# Patient Record
Sex: Male | Born: 1980 | Race: White | Hispanic: No | Marital: Single | Smoking: Never smoker
Health system: Southern US, Community
[De-identification: ages and names within clinical notes are randomized; demographics above are authoritative.]

## PROBLEM LIST (undated history)

## (undated) HISTORY — PX: OTHER SURGICAL HISTORY: SHX169

---

## 2014-11-24 ENCOUNTER — Emergency Department (HOSPITAL_COMMUNITY): Payer: Managed Care, Other (non HMO)

## 2014-11-24 ENCOUNTER — Encounter (HOSPITAL_COMMUNITY): Payer: Self-pay | Admitting: Emergency Medicine

## 2014-11-24 ENCOUNTER — Emergency Department (HOSPITAL_COMMUNITY)
Admission: EM | Admit: 2014-11-24 | Discharge: 2014-11-24 | Disposition: A | Discharge status: Home / Self Care | Payer: Managed Care, Other (non HMO) | Attending: Emergency Medicine | Admitting: Emergency Medicine

## 2014-11-24 DIAGNOSIS — Y999 Unspecified external cause status: Secondary | ICD-10-CM | POA: Diagnosis not present

## 2014-11-24 DIAGNOSIS — M5440 Lumbago with sciatica, unspecified side: Secondary | ICD-10-CM | POA: Insufficient documentation

## 2014-11-24 DIAGNOSIS — M5442 Lumbago with sciatica, left side: Secondary | ICD-10-CM

## 2014-11-24 DIAGNOSIS — Y9389 Activity, other specified: Secondary | ICD-10-CM | POA: Diagnosis not present

## 2014-11-24 DIAGNOSIS — S3992XA Unspecified injury of lower back, initial encounter: Secondary | ICD-10-CM | POA: Diagnosis present

## 2014-11-24 DIAGNOSIS — S199XXA Unspecified injury of neck, initial encounter: Secondary | ICD-10-CM | POA: Insufficient documentation

## 2014-11-24 DIAGNOSIS — Z041 Encounter for examination and observation following transport accident: Secondary | ICD-10-CM

## 2014-11-24 DIAGNOSIS — Y92410 Unspecified street and highway as the place of occurrence of the external cause: Secondary | ICD-10-CM | POA: Insufficient documentation

## 2014-11-24 DIAGNOSIS — M5441 Lumbago with sciatica, right side: Secondary | ICD-10-CM

## 2014-11-24 DIAGNOSIS — R52 Pain, unspecified: Secondary | ICD-10-CM

## 2014-11-24 DIAGNOSIS — Z043 Encounter for examination and observation following other accident: Secondary | ICD-10-CM

## 2014-11-24 MED ORDER — METHOCARBAMOL 500 MG PO TABS
500.0000 mg | ORAL_TABLET | Freq: Once | ORAL | Status: AC
  Administered 2014-11-24: 500 mg via ORAL
  Filled 2014-11-24: qty 1

## 2014-11-24 MED ORDER — NAPROXEN 500 MG PO TABS: 500.0000 mg | ORAL_TABLET | Freq: Two times a day (BID) | ORAL | Status: AC

## 2014-11-24 MED ORDER — METHOCARBAMOL 500 MG PO TABS: 500.0000 mg | ORAL_TABLET | Freq: Two times a day (BID) | ORAL | Status: AC
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
## 2017-11-03 ENCOUNTER — Emergency Department (HOSPITAL_COMMUNITY): Payer: No Typology Code available for payment source

## 2017-11-03 ENCOUNTER — Other Ambulatory Visit: Payer: Self-pay

## 2017-11-03 ENCOUNTER — Encounter (HOSPITAL_COMMUNITY): Payer: Self-pay

## 2017-11-03 ENCOUNTER — Emergency Department (HOSPITAL_COMMUNITY)
Admission: EM | Admit: 2017-11-03 | Discharge: 2017-11-03 | Disposition: A | Discharge status: Home / Self Care | Payer: No Typology Code available for payment source | Attending: Emergency Medicine | Admitting: Emergency Medicine

## 2017-11-03 DIAGNOSIS — K7689 Other specified diseases of liver: Secondary | ICD-10-CM | POA: Diagnosis not present

## 2017-11-03 DIAGNOSIS — R55 Syncope and collapse: Secondary | ICD-10-CM | POA: Insufficient documentation

## 2017-11-03 LAB — CBC WITH DIFFERENTIAL/PLATELET
BASOS ABS: 0.1 10*3/uL (ref 0.0–0.1)
BASOS PCT: 1 %
Eosinophils Absolute: 0.2 10*3/uL (ref 0.0–0.7)
Eosinophils Relative: 3 %
HCT: 41.9 % (ref 39.0–52.0)
HEMOGLOBIN: 14.7 g/dL (ref 13.0–17.0)
Lymphocytes Relative: 29 %
Lymphs Abs: 2.4 10*3/uL (ref 0.7–4.0)
MCH: 30.4 pg (ref 26.0–34.0)
MCHC: 35.1 g/dL (ref 30.0–36.0)
MCV: 86.7 fL (ref 78.0–100.0)
Monocytes Absolute: 1.3 10*3/uL — ABNORMAL HIGH (ref 0.1–1.0)
Monocytes Relative: 16 %
NEUTROS ABS: 4.2 10*3/uL (ref 1.7–7.7)
NEUTROS PCT: 51 %
Platelets: 292 10*3/uL (ref 150–400)
RBC: 4.83 MIL/uL (ref 4.22–5.81)
RDW: 12.8 % (ref 11.5–15.5)
WBC: 8.2 10*3/uL (ref 4.0–10.5)

## 2017-11-03 LAB — COMPREHENSIVE METABOLIC PANEL
ALT: 29 U/L (ref 0–44)
ANION GAP: 8 (ref 5–15)
AST: 46 U/L — ABNORMAL HIGH (ref 15–41)
Albumin: 4.1 g/dL (ref 3.5–5.0)
Alkaline Phosphatase: 52 U/L (ref 38–126)
BILIRUBIN TOTAL: 1.3 mg/dL — AB (ref 0.3–1.2)
BUN: 10 mg/dL (ref 6–20)
CALCIUM: 8.9 mg/dL (ref 8.9–10.3)
CO2: 28 mmol/L (ref 22–32)
Chloride: 105 mmol/L (ref 98–111)
Creatinine, Ser: 0.9 mg/dL (ref 0.61–1.24)
GFR calc non Af Amer: >60 mL/min (ref >60)
GLUCOSE: 98 mg/dL (ref 70–99)
Potassium: 4.1 mmol/L (ref 3.5–5.1)
Sodium: 141 mmol/L (ref 135–145)
TOTAL PROTEIN: 7.1 g/dL (ref 6.5–8.1)

## 2017-11-03 LAB — ETHANOL: Alcohol, Ethyl (B): <10 mg/dL (ref <10)
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
## 2017-11-03 NOTE — ED Notes (Signed)
Pt aware urine sample is needed, pt states that he cannot.

## 2017-11-03 NOTE — ED Notes (Signed)
Pt transported for imaging, unable to obtain ED EKG at this time.
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
